# Patient Record
Sex: Female | Born: 2000 | Race: White | Hispanic: No | Marital: Single | Smoking: Never smoker
Health system: Southern US, Community
[De-identification: ages and names within clinical notes are randomized; demographics above are authoritative.]

## PROBLEM LIST (undated history)

## (undated) DIAGNOSIS — D649 Anemia, unspecified: Secondary | ICD-10-CM

## (undated) HISTORY — PX: NO PAST SURGERIES: SHX2092

---
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
## 2022-02-11 ENCOUNTER — Ambulatory Visit (LOCAL_COMMUNITY_HEALTH_CENTER): Payer: BC Managed Care – PPO

## 2022-02-11 ENCOUNTER — Other Ambulatory Visit: Payer: Self-pay

## 2022-02-11 VITALS — BP 127/83 | HR 66 | Ht 61.0 in | Wt 175.5 lb

## 2022-02-11 DIAGNOSIS — Z30013 Encounter for initial prescription of injectable contraceptive: Secondary | ICD-10-CM

## 2022-02-11 DIAGNOSIS — Z3009 Encounter for other general counseling and advice on contraception: Secondary | ICD-10-CM

## 2022-02-11 NOTE — Progress Notes (Signed)
Depo administered per written order of A Streilein PA-C on 05/28/2021. Client tolerated without complaint. Folic acid counseling completed. Rich Number, RN

## 2022-05-18 ENCOUNTER — Ambulatory Visit (LOCAL_COMMUNITY_HEALTH_CENTER): Payer: BC Managed Care – PPO

## 2022-05-18 DIAGNOSIS — Z3042 Encounter for surveillance of injectable contraceptive: Secondary | ICD-10-CM | POA: Diagnosis not present

## 2022-05-18 DIAGNOSIS — Z3009 Encounter for other general counseling and advice on contraception: Secondary | ICD-10-CM | POA: Diagnosis not present

## 2022-05-18 MED ORDER — MEDROXYPROGESTERONE ACETATE 150 MG/ML IM SUSP
150.0000 mg | Freq: Once | INTRAMUSCULAR | Status: AC
  Administered 2022-05-18: 150 mg via INTRAMUSCULAR

## 2022-05-18 NOTE — Progress Notes (Signed)
13 weeks 5 days post depo.  States spots when close to depo due date.  Stressed adhering to 11 weeks interval between depo appts.  PE due 05/29/22.  Depo given IM in left hip per one time SO per Karyl Kinnier MD.  Tolerated well. Suggested to pt she make appt for PE after today's visit. Depo consent signed by pt today.  Cherlynn Polo, RN

## 2022-05-26 ENCOUNTER — Emergency Department
Admission: EM | Admit: 2022-05-26 | Discharge: 2022-05-26 | Disposition: A | Discharge status: Home / Self Care | Payer: BC Managed Care – PPO | Attending: Emergency Medicine | Admitting: Emergency Medicine

## 2022-05-26 ENCOUNTER — Encounter: Payer: Self-pay | Admitting: Medical Oncology

## 2022-05-26 DIAGNOSIS — D75839 Thrombocytosis, unspecified: Secondary | ICD-10-CM | POA: Insufficient documentation

## 2022-05-26 DIAGNOSIS — F1090 Alcohol use, unspecified, uncomplicated: Secondary | ICD-10-CM | POA: Diagnosis not present

## 2022-05-26 DIAGNOSIS — Z789 Other specified health status: Secondary | ICD-10-CM

## 2022-05-26 DIAGNOSIS — R7309 Other abnormal glucose: Secondary | ICD-10-CM | POA: Diagnosis not present

## 2022-05-26 DIAGNOSIS — Y909 Presence of alcohol in blood, level not specified: Secondary | ICD-10-CM | POA: Insufficient documentation

## 2022-05-26 DIAGNOSIS — R112 Nausea with vomiting, unspecified: Secondary | ICD-10-CM | POA: Diagnosis present

## 2022-05-26 DIAGNOSIS — K529 Noninfective gastroenteritis and colitis, unspecified: Secondary | ICD-10-CM | POA: Insufficient documentation

## 2022-05-26 DIAGNOSIS — R7981 Abnormal blood-gas level: Secondary | ICD-10-CM | POA: Diagnosis not present

## 2022-05-26 LAB — LIPASE, BLOOD: Lipase: 20 U/L (ref 11–51)

## 2022-05-26 LAB — CBC
HCT: 40.6 % (ref 36.0–46.0)
Hemoglobin: 13.5 g/dL (ref 12.0–15.0)
MCH: 27.8 pg (ref 26.0–34.0)
MCHC: 33.3 g/dL (ref 30.0–36.0)
MCV: 83.7 fL (ref 80.0–100.0)
Platelets: 512 10*3/uL — ABNORMAL HIGH (ref 150–400)
RBC: 4.85 MIL/uL (ref 3.87–5.11)
RDW: 12.6 % (ref 11.5–15.5)
WBC: 6 10*3/uL (ref 4.0–10.5)
nRBC: 0 % (ref 0.0–0.2)

## 2022-05-26 LAB — URINALYSIS, ROUTINE W REFLEX MICROSCOPIC
Bilirubin Urine: NEGATIVE
Glucose, UA: 50 mg/dL — AB
Hgb urine dipstick: NEGATIVE
Ketones, ur: 5 mg/dL — AB
Leukocytes,Ua: NEGATIVE
Nitrite: NEGATIVE
Protein, ur: NEGATIVE mg/dL
Specific Gravity, Urine: 1.021 (ref 1.005–1.030)
pH: 8 (ref 5.0–8.0)

## 2022-05-26 LAB — COMPREHENSIVE METABOLIC PANEL
ALT: 30 U/L (ref 0–44)
AST: 31 U/L (ref 15–41)
Albumin: 4.5 g/dL (ref 3.5–5.0)
Alkaline Phosphatase: 71 U/L (ref 38–126)
Anion gap: 10 (ref 5–15)
BUN: 10 mg/dL (ref 6–20)
CO2: 19 mmol/L — ABNORMAL LOW (ref 22–32)
Calcium: 9.7 mg/dL (ref 8.9–10.3)
Chloride: 111 mmol/L (ref 98–111)
Creatinine, Ser: 0.7 mg/dL (ref 0.44–1.00)
GFR, Estimated: >60 mL/min (ref >60)
Glucose, Bld: 159 mg/dL — ABNORMAL HIGH (ref 70–99)
Potassium: 3.9 mmol/L (ref 3.5–5.1)
Sodium: 140 mmol/L (ref 135–145)
Total Bilirubin: 0.3 mg/dL (ref 0.3–1.2)
Total Protein: 8.3 g/dL — ABNORMAL HIGH (ref 6.5–8.1)

## 2022-05-26 LAB — POC URINE PREG, ED: Preg Test, Ur: NEGATIVE

## 2022-05-26 MED ORDER — ONDANSETRON 4 MG PO TBDP
4.0000 mg | ORAL_TABLET | Freq: Once | ORAL | Status: AC | PRN
  Administered 2022-05-26: 4 mg via ORAL
  Filled 2022-05-26: qty 1

## 2022-05-26 MED ORDER — DICYCLOMINE HCL 10 MG PO CAPS: 10.0000 mg | ORAL_CAPSULE | Freq: Once | ORAL | Status: DC

## 2022-05-26 MED ORDER — ONDANSETRON HCL 4 MG/2ML IJ SOLN
4.0000 mg | Freq: Once | INTRAMUSCULAR | Status: AC
  Administered 2022-05-26: 4 mg via INTRAVENOUS
  Filled 2022-05-26: qty 2

## 2022-05-26 MED ORDER — SODIUM CHLORIDE 0.9 % IV BOLUS
1000.0000 mL | Freq: Once | INTRAVENOUS | Status: AC
  Administered 2022-05-26: 1000 mL via INTRAVENOUS

## 2022-05-26 MED ORDER — PANTOPRAZOLE SODIUM 40 MG IV SOLR
40.0000 mg | Freq: Once | INTRAVENOUS | Status: AC
  Administered 2022-05-26: 40 mg via INTRAVENOUS
  Filled 2022-05-26: qty 10

## 2022-05-26 MED ORDER — ACETAMINOPHEN 500 MG PO TABS
1000.0000 mg | ORAL_TABLET | Freq: Once | ORAL | Status: AC
  Administered 2022-05-26: 1000 mg via ORAL
  Filled 2022-05-26: qty 2

## 2022-05-26 MED ORDER — ONDANSETRON 4 MG PO TBDP: 4.0000 mg | ORAL_TABLET | Freq: Three times a day (TID) | ORAL | 0 refills | Status: AC | PRN

## 2022-05-26 MED ORDER — KETOROLAC TROMETHAMINE 15 MG/ML IJ SOLN
15.0000 mg | Freq: Once | INTRAMUSCULAR | Status: AC
  Administered 2022-05-26: 15 mg via INTRAVENOUS
  Filled 2022-05-26: qty 1

## 2022-05-26 NOTE — Discharge Instructions (Addendum)
Understands to return if her abdominal pain is changing or getting worse or to follow-up tomorrow for recheck can take Tylenol 1 g every 8 hours and you can get some over-the-counter Imodium if you continue to have diarrhea.  You take the Zofran to help with nausea.  We have discussed imaging but elected to hold off suspect this is most likely viral illness given the vomiting and diarrhea however if you have continued abdominal pain you need to have a repeat evaluation tomorrow or return to the ER sooner if things are getting worse develop fevers or any other concerns  Try to avoid alcohol and THC as these can worsening symptoms.

## 2022-05-26 NOTE — ED Triage Notes (Signed)
Pt reports that she began last night having lower abd pain with NVD.

## 2022-05-26 NOTE — ED Provider Notes (Signed)
[facility] Provider Note    Event Date/Time   First MD Initiated Contact with Patient 05/26/22 1115     (approximate)   History   Abdominal Pain   HPI  Katherine Gray is a 21 y.o. female who comes in with lower abdominal pain with nausea vomiting diarrhea that started last night.  Patient reports that the prior night she was having a few shots of alcohol and did smoke a little bit of weed.  She then woke up around 2 AM with multiple episodes of diarrhea as well as vomiting.  She reports that she can not say how many it was because it was too many to count.  She denies this happening previously when she has had alcohol and denies any other known sick contacts.  She reports she is having a lot of aching in her abdomen with it.  The vomiting and diarrhea are nonbloody nonbilious.  Denies any recent antibiotics, travel out of the country, drinking like water.  Denies any history of ovarian cyst.  Does report being sexually active for the past year but reports prior STD testing during this year that was negative and denies any vaginal discharge or symptoms of STDs   Physical Exam   Triage Vital Signs: ED Triage Vitals [05/26/22 1101]  Enc Vitals Group     BP (!) 153/76     Pulse Rate 79     Resp 18     Temp 97.6 F (36.4 C)     Temp Source Oral     SpO2 100 %     Weight 179 lb (81.2 kg)     Height 5\' 1"  (1.549 m)     Head Circumference      Peak Flow      Pain Score 10     Pain Loc      Pain Edu?      Excl. in [place]?     Most recent vital signs: Vitals:   05/26/22 1101  BP: (!) 153/76  Pulse: 79  Resp: 18  Temp: 97.6 F (36.4 C)  SpO2: 100%     General: Awake, no distress.  CV:  Good peripheral perfusion.  Resp:  Normal effort.  Abd:  No distention.  Patient reports for some generalized discomfort but upon palpation she denies any increasing pain Other:     ED Results / Procedures / Treatments   Labs (all labs ordered are  listed, but only abnormal results are displayed) Labs Reviewed  CBC - Abnormal; Notable for the following components:      Result Value   Platelets 512 (*)    All other components within normal limits  LIPASE, BLOOD  COMPREHENSIVE METABOLIC PANEL  URINALYSIS, ROUTINE W REFLEX MICROSCOPIC  POC URINE PREG, ED      RADIOLOGY None   PROCEDURES:  Critical Care performed: No  Procedures   MEDICATIONS ORDERED IN ED: Medications  ondansetron (ZOFRAN-ODT) disintegrating tablet 4 mg (4 mg Oral Given 05/26/22 1104)     IMPRESSION / MDM / [place] / ED COURSE  I reviewed the triage vital signs and the nursing notes.   Patient's presentation is most consistent with acute complicated illness / injury requiring diagnostic workup.  I suspect this is most likely gastroenteritis versus secondary to alcohol, THC use.  Currently her abdomen is soft and nontender I have lower suspicion for appendicitis but will get blood work to evaluate for any significant white count elevation, electrolyte abnormalities,  AKI.  She denies any risk factors for PID and would not explain all the copious amounts of diarrhea.  Also do not feel that this is related to the cyst given her description in the HPI-we will give patient some symptomatic management   Preg test was negative.  CBC is reassuring with slightly elevated platelets but white count is normal. Ua no uti.  CMP slightly low bicarb at patient is getting some IV fluids.  Glucose is slightly elevated  1:11 PM on repeat evaluation patient patient is abdomen is soft nontender she reports feeling much better.  She is had no continued diarrhea.  We will try to p.o. challenge.  We discussed CT imaging with mother in the room and are going to hold off but we discussed returning tomorrow if develops continued abdominal pain or any other concerns   1:29 PM on repeat evaluation patient is tolerating p.o.  Soft and nontender and is requesting discharge  home.     FINAL CLINICAL IMPRESSION(S) / ED DIAGNOSES   Final diagnoses:  Gastroenteritis  Alcohol use     Rx / DC Orders   ED Discharge Orders     None        Note:  This document was prepared using Dragon voice recognition software and may include unintentional dictation errors.   Vanessa Cliffside, MD 05/26/22 1332

## 2022-05-26 NOTE — ED Notes (Signed)
dc ppw provided. followup and rx information reviewed as applicable. Pt questions addressed at this time. Pt declines VS at time of DC and provides verbal consent for DC. Pt ambulatory to lobby alert and oriented. 

## 2022-08-19 ENCOUNTER — Encounter: Payer: Self-pay | Admitting: Nurse Practitioner

## 2022-08-19 ENCOUNTER — Ambulatory Visit (LOCAL_COMMUNITY_HEALTH_CENTER): Payer: BC Managed Care – PPO | Admitting: Nurse Practitioner

## 2022-08-19 VITALS — BP 117/84 | Ht 61.0 in | Wt 174.2 lb

## 2022-08-19 DIAGNOSIS — Z0000 Encounter for general adult medical examination without abnormal findings: Secondary | ICD-10-CM

## 2022-08-19 DIAGNOSIS — Z3009 Encounter for other general counseling and advice on contraception: Secondary | ICD-10-CM | POA: Diagnosis not present

## 2022-08-19 DIAGNOSIS — Z3042 Encounter for surveillance of injectable contraceptive: Secondary | ICD-10-CM | POA: Diagnosis not present

## 2022-08-19 MED ORDER — MEDROXYPROGESTERONE ACETATE 150 MG/ML IM SUSP
150.0000 mg | INTRAMUSCULAR | Status: AC
  Administered 2022-08-19 – 2022-11-22 (×2): 150 mg via INTRAMUSCULAR

## 2022-08-19 NOTE — Progress Notes (Signed)
Pt here for PE and Depo injection.   Depo 150 mg given IM in RUOQ.  Pt tolerated well.  Pt given reminder card to return in 11-13 weeks for next Depo.  Berdie Ogren, RN

## 2022-08-19 NOTE — Progress Notes (Signed)
[facility] DEPARTMENT [facility] [address] Main Number: [phone]    Family Planning Visit- Initial Visit  Subjective:  Katherine Gray is a 21 y.o.  G1P0101   being seen today for an initial annual visit and to discuss reproductive life planning.  The patient is currently using Hormonal Injection for pregnancy prevention. Patient reports   does not want a pregnancy in the next year.     report they are looking for a method that provides High efficacy at preventing pregnancy  Patient has the following medical conditions has Obesity (BMI 30.0-34.9) on their problem list.  Chief Complaint  Patient presents with   Contraception    Physical and depo     Patient reports to clinic today for a physical and Depo.    Body mass index is 32.91 kg/m. - Patient is eligible for diabetes screening based on BMI and age >1?  not applicable HA1C ordered? not applicable  Patient reports 1  partner/s in last year. Desires STI screening?  No, declined   Has patient been screened once for HCV in the past?  No  No results found for: "HCVAB"  Does the patient have current drug use (including MJ), have a partner with drug use, and/or has been incarcerated since last result? Yes  If yes-- Screen for HCV through [facility]   Does the patient meet criteria for HBV testing? Yes  Criteria:  -Household, sexual or needle sharing contact with HBV -History of drug use -HIV positive -Those with known Hep C   Health Maintenance Due  Topic Date Due   COVID-19 Vaccine (1) Never done   HPV VACCINES (1 - 2-dose series) Never done   CHLAMYDIA SCREENING  Never done   Hepatitis C Screening  Never done   TETANUS/TDAP  Never done   INFLUENZA VACCINE  07/20/2022    Review of Systems  Constitutional:  Negative for chills, fever, malaise/fatigue and weight loss.       Weight fluctuation  HENT:  Negative for congestion, hearing loss and sore  throat.   Eyes:  Negative for blurred vision, double vision and photophobia.  Respiratory:  Negative for shortness of breath.   Cardiovascular:  Negative for chest pain.  Gastrointestinal:  Negative for abdominal pain, blood in stool, constipation, diarrhea, heartburn, nausea and vomiting.  Genitourinary:  Negative for dysuria and frequency.  Musculoskeletal:  Negative for back pain, joint pain and neck pain.  Skin:  Negative for itching and rash.  Neurological:  Positive for headaches. Negative for dizziness and weakness.  Endo/Heme/Allergies:  Does not bruise/bleed easily.  Psychiatric/Behavioral:  Negative for depression, substance abuse and suicidal ideas.     The following portions of the patient's history were reviewed and updated as appropriate: allergies, current medications, past family history, past medical history, past social history, past surgical history and problem list. Problem list updated.   See flowsheet for other program required questions.  Objective:   Vitals:   08/19/22 1052  BP: 117/84  Weight: 174 lb 3.2 oz (79 kg)  Height: 5\' 1"  (1.549 m)    Physical Exam Constitutional:      Appearance: Normal appearance.  HENT:     Head: Normocephalic. No abrasion, masses or laceration. Hair is normal.     Jaw: No tenderness or swelling.     Right Ear: External ear normal.     Left Ear: External ear normal.     Nose: Nose normal.  Mouth/Throat:     Lips: Pink. No lesions.     Mouth: Mucous membranes are moist. No lacerations or oral lesions.     Dentition: No dental caries.     Tongue: No lesions.     Palate: No mass and lesions.     Pharynx: No pharyngeal swelling, oropharyngeal exudate, posterior oropharyngeal erythema or uvula swelling.     Tonsils: No tonsillar exudate or tonsillar abscesses.     Comments: No visible signs of dental caries  Eyes:     Pupils: Pupils are equal, round, and reactive to light.  Neck:     Thyroid: No thyroid mass,  thyromegaly or thyroid tenderness.  Cardiovascular:     Rate and Rhythm: Normal rate and regular rhythm.  Pulmonary:     Effort: Pulmonary effort is normal.     Breath sounds: Normal breath sounds.  Abdominal:     General: Abdomen is flat. Bowel sounds are normal.     Palpations: Abdomen is soft.     Tenderness: There is no abdominal tenderness. There is no rebound.  Musculoskeletal:     Cervical back: Full passive range of motion without pain and normal range of motion.  Lymphadenopathy:     Cervical: No cervical adenopathy.     Right cervical: No superficial, deep or posterior cervical adenopathy.    Left cervical: No superficial, deep or posterior cervical adenopathy.     Upper Body:     Right upper body: No epitrochlear adenopathy.     Left upper body: No epitrochlear adenopathy.  Skin:    General: Skin is warm and dry.     Findings: No erythema, laceration, lesion or rash.  Neurological:     Mental Status: She is alert and oriented to person, place, and time.  Psychiatric:        Attention and Perception: Attention normal.        Mood and Affect: Mood normal.        Speech: Speech normal.        Behavior: Behavior normal. Behavior is cooperative.       Assessment and Plan:  Katherine Gray is a 21 y.o. female presenting to the [facility] Department for an initial annual wellness/contraceptive visit  Contraception counseling: Reviewed options based on patient desire and reproductive life plan. Patient is interested in Hormonal Injection. This was provided to the patient today.   Risks, benefits, and typical effectiveness rates were reviewed.  Questions were answered.  Written information was also given to the patient to review.    The patient will follow up in  11 weeks for surveillance and Depo.  The patient was told to call with any further questions, or with any concerns about this method of contraception.  Emphasized use of condoms 100% of the  time for STI prevention.  Need for ECP was assessed. ECP not offered due to continued use of birth control.   1. Family planning counseling -21 year old female in clinic today for a physical and birth control. - ROS reviewed, patient with complaints of headaches and weight fluctuation.  Patient reports that at times she gets headaches but denies severe, light sensitivity, and nausea with headaches.  Advised patient to take medication as needed for headaches and to increase water intake, decrease fat and carb intake, and increase physical activity.   -Patient desires to continue with Depo as a method.  -Declines STD screening at this time.   2. Well woman exam (no gynecological  exam) -Normal well woman exam. -CBE at age 52 PAP at age 33   3. Surveillance for Depo-Provera contraception -May have Depo 150 MG IM q 11-13 weeks x 1 year.  - medroxyPROGESTERone (DEPO-PROVERA) injection 150 mg  Total time spent: 30 minutes    Return in about 11 weeks (around 11/04/2022) for Routine DMPA injection.    Gregary Cromer, FNP

## 2022-11-22 ENCOUNTER — Ambulatory Visit (LOCAL_COMMUNITY_HEALTH_CENTER): Payer: BC Managed Care – PPO

## 2022-11-22 VITALS — BP 134/70 | Ht 61.0 in | Wt 178.5 lb

## 2022-11-22 DIAGNOSIS — Z3042 Encounter for surveillance of injectable contraceptive: Secondary | ICD-10-CM

## 2022-11-22 DIAGNOSIS — Z3009 Encounter for other general counseling and advice on contraception: Secondary | ICD-10-CM | POA: Diagnosis not present

## 2022-11-22 NOTE — Progress Notes (Signed)
13 weeks 4 days post Depo.  Depo given IM in LUOQ.  Tolerated well. Order by Glenna Fellows FNP dated 08/19/2022 for 1 year. Voiced no concerns.  Reminder card provided to call for next injection due 02/07/2023.

## 2024-11-05 ENCOUNTER — Ambulatory Visit

## 2024-11-05 VITALS — BP 130/73 | Ht 61.0 in | Wt 153.0 lb

## 2024-11-05 DIAGNOSIS — Z309 Encounter for contraceptive management, unspecified: Secondary | ICD-10-CM

## 2024-11-05 DIAGNOSIS — Z3201 Encounter for pregnancy test, result positive: Secondary | ICD-10-CM

## 2024-11-05 LAB — PREGNANCY, URINE: Preg Test, Ur: POSITIVE — AB

## 2024-11-05 MED ORDER — PRENATAL 27-0.8 MG PO TABS: 1.0000 | ORAL_TABLET | Freq: Every day | ORAL | Status: AC

## 2024-11-05 NOTE — Progress Notes (Signed)
 UPT positive. Plans prenatal care at [facility] clinic.   Positive preg packet given and reviewed.   The patient was dispensed prenatal vitamins #100 today per SO Dr JAYSON Helling I provided counseling today regarding the medication. We discussed the medication, the side effects and when to call clinic. Patient given the opportunity to ask questions. Questions answered.    Sent to clerk for presumptive eligibility/medicaid preg women. Ralpheal Zappone, RN

## 2024-11-09 ENCOUNTER — Other Ambulatory Visit: Payer: Self-pay

## 2024-11-09 ENCOUNTER — Emergency Department: Payer: PRIVATE HEALTH INSURANCE

## 2024-11-09 ENCOUNTER — Emergency Department
Admission: EM | Admit: 2024-11-09 | Discharge: 2024-11-09 | Disposition: A | Discharge status: Home / Self Care | Payer: PRIVATE HEALTH INSURANCE

## 2024-11-09 DIAGNOSIS — O209 Hemorrhage in early pregnancy, unspecified: Secondary | ICD-10-CM | POA: Insufficient documentation

## 2024-11-09 DIAGNOSIS — O4690 Antepartum hemorrhage, unspecified, unspecified trimester: Secondary | ICD-10-CM

## 2024-11-09 DIAGNOSIS — Z3A00 Weeks of gestation of pregnancy not specified: Secondary | ICD-10-CM | POA: Diagnosis not present

## 2024-11-09 DIAGNOSIS — O3680X0 Pregnancy with inconclusive fetal viability, not applicable or unspecified: Secondary | ICD-10-CM

## 2024-11-09 LAB — TYPE AND SCREEN
ABO/RH(D): O POS
Antibody Screen: NEGATIVE

## 2024-11-09 LAB — CBC WITH DIFFERENTIAL/PLATELET
Abs Immature Granulocytes: 0 K/uL (ref 0.00–0.07)
Basophils Absolute: 0.1 K/uL (ref 0.0–0.1)
Basophils Relative: 2 %
Eosinophils Absolute: 0.2 K/uL (ref 0.0–0.5)
Eosinophils Relative: 4 %
HCT: 35.1 % — ABNORMAL LOW (ref 36.0–46.0)
Hemoglobin: 12.1 g/dL (ref 12.0–15.0)
Immature Granulocytes: 0 %
Lymphocytes Relative: 43 %
Lymphs Abs: 2.5 K/uL (ref 0.7–4.0)
MCH: 28.7 pg (ref 26.0–34.0)
MCHC: 34.5 g/dL (ref 30.0–36.0)
MCV: 83.2 fL (ref 80.0–100.0)
Monocytes Absolute: 0.5 K/uL (ref 0.1–1.0)
Monocytes Relative: 9 %
Neutro Abs: 2.6 K/uL (ref 1.7–7.7)
Neutrophils Relative %: 42 %
Platelets: 278 K/uL (ref 150–400)
RBC: 4.22 MIL/uL (ref 3.87–5.11)
RDW: 12.4 % (ref 11.5–15.5)
WBC: 6 K/uL (ref 4.0–10.5)
nRBC: 0 % (ref 0.0–0.2)

## 2024-11-09 LAB — COMPREHENSIVE METABOLIC PANEL WITH GFR
ALT: 17 U/L (ref 0–44)
AST: 25 U/L (ref 15–41)
Albumin: 4.6 g/dL (ref 3.5–5.0)
Alkaline Phosphatase: 69 U/L (ref 38–126)
Anion gap: 12 (ref 5–15)
BUN: 8 mg/dL (ref 6–20)
CO2: 22 mmol/L (ref 22–32)
Calcium: 9.2 mg/dL (ref 8.9–10.3)
Chloride: 104 mmol/L (ref 98–111)
Creatinine, Ser: 0.72 mg/dL (ref 0.44–1.00)
GFR, Estimated: >60 mL/min (ref >60)
Glucose, Bld: 118 mg/dL — ABNORMAL HIGH (ref 70–99)
Potassium: 3.6 mmol/L (ref 3.5–5.1)
Sodium: 137 mmol/L (ref 135–145)
Total Bilirubin: 0.2 mg/dL (ref 0.0–1.2)
Total Protein: 7.8 g/dL (ref 6.5–8.1)

## 2024-11-09 LAB — URINALYSIS, ROUTINE W REFLEX MICROSCOPIC
Bacteria, UA: NONE SEEN
Bacteria, UA: NONE SEEN
Bilirubin Urine: NEGATIVE
Glucose, UA: NEGATIVE mg/dL
Ketones, ur: NEGATIVE mg/dL
Leukocytes,Ua: NEGATIVE
Nitrite: NEGATIVE
Protein, ur: NEGATIVE mg/dL
RBC / HPF: >50 RBC/hpf (ref 0–5)
RBC / HPF: >50 RBC/hpf (ref 0–5)
Specific Gravity, Urine: 1.008 (ref 1.005–1.030)
pH: 8 (ref 5.0–8.0)

## 2024-11-09 LAB — POC URINE PREG, ED: Preg Test, Ur: NEGATIVE

## 2024-11-09 LAB — HCG, QUANTITATIVE, PREGNANCY: hCG, Beta Chain, Quant, S: 51 m[IU]/mL — ABNORMAL HIGH (ref <5)

## 2024-11-09 NOTE — ED Triage Notes (Signed)
 Pt comes in via pov with complaints of vaginal bleeding that started this morning. Pt states that she found out she was pregnant on Monday, and is believed to be about 5 weeks. Pt states that she was able to fill 3 panty liners. Pt has no complaints of abdominal pain or cramping at this time. Pt also states that she had abdominal pain earlier today, after having a bowel movement she felt relief. Pt did notice bleeding at that time as well.

## 2024-11-09 NOTE — ED Provider Notes (Signed)
-----------------------------------------   6:27 PM on 11/09/2024 -----------------------------------------  Blood pressure 130/79, pulse 93, temperature 98.2 F (36.8 C), resp. rate 18, height 5' 1 (1.549 m), weight 70.3 kg, last menstrual period 10/01/2024, SpO2 100%.  Assuming care from [place], PA-C.  In short, Katherine Gray is a 23 y.o. female with a chief complaint of vaginal bleeding.  Refer to the original H&P for additional details.  The current plan of care is to await ultrasound results and repeat UA.  ----------------------------------------- 6:34 PM on 11/09/2024 -----------------------------------------  Ultrasound results discussed with patient.  She is advised to follow-up with OB/GYN in 2 days for repeat hCG lab and ultrasound.  Advised her if she is unable to follow-up with OB/GYN she is more than welcome to return to the ED.  She verbalized understanding.  She is in stable condition for discharge home.  ED return precaution discussed.   Clinical Course as of 11/09/24 1955  Fri Nov 09, 2024  1805 WBC: 6.0 No leukocytosis which is reassuring [SD]  1926 US  OB LESS THAN 14 WEEKS WITH OB TRANSVAGINAL IMPRESSION: 1. Pregnancy of unknown location. Of an intrauterine pregnancy could indicate early intrauterine gestation, which is too early to see, spontaneous loss of pregnancy ectopic pregnancy. Recommend close clinical follow-up with serial quantitative -hcg levels and short-term ultrasound in 7 to 10 days for further evaluation unless earlier imaging is clinically indicated. . 2. Small free pelvic fluid. 3. Probable complex right ovarian cyst measuring 3.7 x 2.1 cm.   [MH]  1953 Urinalysis, Routine w reflex microscopic -Urine, Clean Catch(!) Large Hgb however no infectious etiology noted [MH]    Clinical Course User Index [MH] Margrette, Jazma Pickel A, PA-C [SD] Sheron, Summer, PA-C     Medications - No data to display   ED Discharge Orders      None      Final diagnoses:  Vaginal bleeding affecting early pregnancy      Margrette Rebbeca LABOR, PA-C 11/09/24 1955    Nicholaus Rolland BRAVO, MD 11/09/24 2018

## 2024-11-09 NOTE — ED Notes (Signed)
 This RN accompanied PA Dunlap for rectal and pelvic examination. Patient tolerated exam well without any complaints.

## 2024-11-09 NOTE — ED Provider Notes (Signed)
 [facility] Provider Note    Event Date/Time   First MD Initiated Contact with Patient 11/09/24 1632     (approximate)   History   Vaginal Bleeding   HPI  Katherine Gray is a 23 y.o. female  G2P0101 with no significant past medical history presents to the emergency department with vaginal bleeding that started this morning.  States she filled half a pad this morning and had some abdominal pain, but had a bowel movement and the pain went away.  Patient reports she has one pad on now and it has filled front to back today. She denies N/V/D, chest pain, SOB, dysuria, abnormal vaginal discharge, back pain, abdominal pain, rectal bleeding or pain.   Patient found out that she was pregnant on Monday 11/05/24 with positive confirmation only by UPT per progress note with RN Amadeo Pines. She believes she is about [redacted] weeks pregnant.  LMP 10/01/2024. No prior abortions. Has one living child. Has had 1 premature birth at 31 weeks in 2019. Is planning on establishing prenatal care with [facility].   Physical Exam   Triage Vital Signs: ED Triage Vitals  Encounter Vitals Group     BP 11/09/24 1622 (!) 160/89     Girls Systolic BP Percentile --      Girls Diastolic BP Percentile --      Boys Systolic BP Percentile --      Boys Diastolic BP Percentile --      Pulse Rate 11/09/24 1622 93     Resp 11/09/24 1622 18     Temp 11/09/24 1622 98.2 F (36.8 C)     Temp src --      SpO2 11/09/24 1622 100 %     Weight 11/09/24 1624 155 lb (70.3 kg)     Height 11/09/24 1624 5' 1 (1.549 m)     Head Circumference --      Peak Flow --      Pain Score 11/09/24 1623 0     Pain Loc --      Pain Education --      Exclude from Growth Chart --     Most recent vital signs: Vitals:   11/09/24 1622 11/09/24 1655  BP: (!) 160/89 130/79  Pulse: 93   Resp: 18   Temp: 98.2 F (36.8 C)   SpO2: 100%     General: Awake, in no acute distress. Appears stated  age. Neck: Supple. CV: Good peripheral perfusion. 93 bpm. Respiratory: Normal respiratory effort.  No respiratory distress. CTAB. GI: Soft, non-distended, non-tender. No rebound or guarding. Skin:Warm, dry, intact. No rashes, lesions, or ecchymosis. No cyanosis or pallor. GU: RN Chiquita Bare present as chaperone for exam. No swelling, erythema, lesions of the labia majora or minora. No active gross bleeding at this time. Patient does have a pad filled with blood but no clots.  No abnormalities or gross bleeding seen on visualization of the anus.  ED Results / Procedures / Treatments   Labs (all labs ordered are listed, but only abnormal results are displayed) Labs Reviewed  CBC WITH DIFFERENTIAL/PLATELET - Abnormal; Notable for the following components:      Result Value   HCT 35.1 (*)    All other components within normal limits  COMPREHENSIVE METABOLIC PANEL WITH GFR - Abnormal; Notable for the following components:   Glucose, Bld 118 (*)    All other components within normal limits  URINALYSIS, ROUTINE W REFLEX MICROSCOPIC - Abnormal; Notable for  the following components:   Color, Urine AMBER (*)    APPearance TURBID (*)    Glucose, UA   (*)    Value: TEST NOT REPORTED DUE TO COLOR INTERFERENCE OF URINE PIGMENT   Hgb urine dipstick   (*)    Value: TEST NOT REPORTED DUE TO COLOR INTERFERENCE OF URINE PIGMENT   Bilirubin Urine   (*)    Value: TEST NOT REPORTED DUE TO COLOR INTERFERENCE OF URINE PIGMENT   Ketones, ur   (*)    Value: TEST NOT REPORTED DUE TO COLOR INTERFERENCE OF URINE PIGMENT   Protein, ur   (*)    Value: TEST NOT REPORTED DUE TO COLOR INTERFERENCE OF URINE PIGMENT   Nitrite   (*)    Value: TEST NOT REPORTED DUE TO COLOR INTERFERENCE OF URINE PIGMENT   Leukocytes,Ua   (*)    Value: TEST NOT REPORTED DUE TO COLOR INTERFERENCE OF URINE PIGMENT   All other components within normal limits  HCG, QUANTITATIVE, PREGNANCY - Abnormal; Notable for the following  components:   hCG, Beta Chain, Quant, S 51 (*)    All other components within normal limits  POC URINE PREG, ED - Normal  URINALYSIS, ROUTINE W REFLEX MICROSCOPIC  TYPE AND SCREEN     EKG     RADIOLOGY US  OB <14 weeks w/ transvaginal in process.  PROCEDURES:  Critical Care performed: No   Procedures   MEDICATIONS ORDERED IN ED: Medications - No data to display   IMPRESSION / MDM / ASSESSMENT AND PLAN / ED COURSE  I reviewed the triage vital signs and the nursing notes.                              Differential diagnosis includes, but is not limited to, threatened miscarriage, ectopic pregnancy, gestational trophoblastic disease, UTI, ectropion  Patient's presentation is most consistent with acute complicated illness / injury requiring diagnostic workup.  Patient is here with signs and symptoms as described above.  She is well-appearing and hemodynamically stable.  She only had a urine pregnancy test completed this past Monday; no blood work.  Current beta-hCG is 51.  CMP unremarkable.  CBC with mildly decreased hematocrit at 35.1, hemoglobin stable at 12.1. UPT negative. UA was inconclusive due to color interference so second UA was ordered. US  OB <14 weeks w/ transvaginal in process.  This is at the end of my shift so I will transfer care to colleague PA-C, Rebbeca Minerva for follow up of ultrasound results to confirm or deny IUP. Suspect this patient will need to have her beta hcg trending outpatient with follow up with OBGYN.   FINAL CLINICAL IMPRESSION(S) / ED DIAGNOSES   Final diagnoses:  Vaginal bleeding affecting early pregnancy     Rx / DC Orders   ED Discharge Orders     None        Note:  This document was prepared using Dragon voice recognition software and may include unintentional dictation errors.     Sheron Salm, PA-C 11/09/24 1818    Nicholaus Rolland BRAVO, MD 11/09/24 2018

## 2024-11-09 NOTE — Discharge Instructions (Addendum)
 Your evaluated in the ED with complaint of vaginal bleeding during early pregnancy.  Your ultrasound shows pregnancy of unknown location which could indicate due to early intrauterine gestation which is too early to see.  We recommend close clinical follow-up with repeat hCG levels and ultrasound in 2 days.  Please follow-up with your OB/GYN.  If you are unable to set up appointment you may return to ED for these labs.  Your urinalysis does not show any signs of infection.
